# Patient Record
Sex: Female | Born: 1980 | Race: Black or African American | Hispanic: No | Marital: Single | Smoking: Never smoker
Health system: Southern US, Community
[De-identification: ages and names within clinical notes are randomized; demographics above are authoritative.]

## PROBLEM LIST (undated history)

## (undated) DIAGNOSIS — D649 Anemia, unspecified: Secondary | ICD-10-CM

## (undated) DIAGNOSIS — D569 Thalassemia, unspecified: Secondary | ICD-10-CM

---

## 2006-10-22 DIAGNOSIS — D569 Thalassemia, unspecified: Secondary | ICD-10-CM

## 2006-10-22 HISTORY — DX: Thalassemia, unspecified: D56.9

## 2012-06-07 ENCOUNTER — Emergency Department (HOSPITAL_COMMUNITY)
Admission: EM | Admit: 2012-06-07 | Discharge: 2012-06-07 | Disposition: A | Discharge status: Home / Self Care | Payer: BC Managed Care – HMO | Attending: Emergency Medicine | Admitting: Emergency Medicine

## 2012-06-07 ENCOUNTER — Encounter (HOSPITAL_COMMUNITY): Payer: Self-pay | Admitting: *Deleted

## 2012-06-07 DIAGNOSIS — I951 Orthostatic hypotension: Secondary | ICD-10-CM

## 2012-06-07 DIAGNOSIS — D649 Anemia, unspecified: Secondary | ICD-10-CM

## 2012-06-07 HISTORY — DX: Anemia, unspecified: D64.9

## 2012-06-07 HISTORY — DX: Thalassemia, unspecified: D56.9

## 2012-06-07 LAB — POCT I-STAT, CHEM 8
BUN: 16 mg/dL (ref 6–23)
Calcium, Ion: 1.21 mmol/L (ref 1.12–1.23)
Chloride: 103 meq/L (ref 96–112)
Creatinine, Ser: 0.9 mg/dL (ref 0.50–1.10)
Glucose, Bld: 95 mg/dL (ref 70–99)
HCT: 31 % — ABNORMAL LOW (ref 36.0–46.0)
Hemoglobin: 10.5 g/dL — ABNORMAL LOW (ref 12.0–15.0)
Potassium: 3.7 meq/L (ref 3.5–5.1)
Sodium: 140 meq/L (ref 135–145)
TCO2: 25 mmol/L (ref 0–100)

## 2012-06-07 NOTE — ED Provider Notes (Signed)
History     CSN: [id number]  Arrival date & time 06/07/12  1625   First MD Initiated Contact with Patient 06/07/12 1727      Chief Complaint  Patient presents with  . Weakness    HPI:  31 year old female with a history of anemia and probably a hemoglobinopathy, presenting with dizziness and fatigue.  Symptoms began this morning when she woke up with dizziness described as lightheadedness especially with position changes, fatigue, and cognitive impairment.  Today at work she has had trouble thinking and concentrating and has felt cold and tired.  She began menstruating last night and has already needed 6 pads.  She thinks it is heavier than usual.  Her last period was July around the same time.  Patient's mother has anemia and has required 6 transfusions during her lifetime.  Patient has never been told if her mother has a hemoglobinopathy.  During the patient's pregnancy 5 years ago, she was anemic needing a transfusion.  She remembers hearing at that time that her blood cells were "sickle like" and she thinks she remembers hearing thalassemia as well.  The patient's normal menstruations last 5 to 6 days and require 6 pads a day at the peak.   Past Medical History  Diagnosis Date  . Anemia   . Thalassemia 2008    Thinks she was told she has this during her pregnancy    History reviewed. No pertinent past surgical history.  Family History  Problem Relation Age of Onset  . Hypertension Father   . Anemia Mother     6 transfusions    History  Substance Use Topics  . Smoking status: Never Smoker   . Smokeless tobacco: Never Used  . Alcohol Use: No    OB History    Grav Para Term Preterm Abortions TAB SAB Ect Mult Living   2 1 1  0 1 0 0 0 0 1      Review of Systems  All other systems reviewed and are negative.    Allergies  Latex  Home Medications   Current Outpatient Rx  Name Route Sig Dispense Refill  . FERROUS SULFATE 325 (65 FE) MG PO TABS Oral Take 325 mg  by mouth daily.      BP 93/69  Pulse 75  Temp 98.1 F (36.7 C) (Oral)  Resp 20  SpO2 100%  LMP 06/07/2012  Physical Exam  Constitutional: She appears well-developed and well-nourished. No distress.  HENT:  Head: Normocephalic and atraumatic.  Mouth/Throat: Uvula is midline, oropharynx is clear and moist and mucous membranes are normal.  Eyes: Conjunctivae are normal. Pupils are equal, round, and reactive to light.  Neck: Neck supple. No mass and no thyromegaly present.  Cardiovascular: Normal rate, regular rhythm and normal heart sounds.   No murmur heard. Pulmonary/Chest: Effort normal and breath sounds normal.  Abdominal: Soft. Normal appearance and bowel sounds are normal. She exhibits no mass. There is no hepatosplenomegaly. There is no tenderness.  Skin: Skin is warm, dry and intact.    ED Course  Procedures (including critical care time)  Labs Reviewed  POCT I-STAT, CHEM 8 - Abnormal; Notable for the following:    Hemoglobin 10.5 (*)     HCT 31.0 (*)     All other components within normal limits   No results found.   1. Anemia   2. Orthostatic hypotension       MDM  1.   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  A hemoglobin of 10.5, if this is the case, would not be abnormal.  Her history of heavy menstruation could be compounding, but her hemoglobin is probably not decreased from baseline.  We instructed patient to take her iron supplement every day.  2.   Orthostatic hypotension:  Most likely hypovolemia from her heavy menstruation.  She is not on any medicine that would cause or worsen this.  Autonomic dysfunction is unlikely and adrenal insufficiency is less likely given her normal serum electrolytes, but this is certainly not ruled out.  Her presenting symptoms are most likely from hypotension rather than anemia.  Patient has been instructed to increase fluid and salt intake and establish with a primary care provider soon.  Lollie Sails, MD 06/07/12 [phone]

## 2012-06-07 NOTE — ED Notes (Signed)
Unable to use the bathroom at this time, pt is aware of the urine sample needed

## 2012-06-07 NOTE — ED Notes (Signed)
Pt reports hx of anemia, today having generalized weakness, foggy memory and chills. Pt thinks its due to anemia and wants hgb checked.

## 2012-06-07 NOTE — ED Provider Notes (Signed)
I reviewed the resident's note and I agree with the findings and plan.     Nelia Shi, MD 06/07/12 [phone]

## 2013-09-01 ENCOUNTER — Emergency Department (HOSPITAL_COMMUNITY)
Admission: EM | Admit: 2013-09-01 | Discharge: 2013-09-01 | Disposition: A | Discharge status: Home / Self Care | Payer: No Typology Code available for payment source | Attending: Emergency Medicine | Admitting: Emergency Medicine

## 2013-09-01 ENCOUNTER — Encounter (HOSPITAL_COMMUNITY): Payer: Self-pay | Admitting: Emergency Medicine

## 2013-09-01 ENCOUNTER — Emergency Department (HOSPITAL_COMMUNITY): Payer: No Typology Code available for payment source

## 2013-09-01 DIAGNOSIS — M545 Low back pain, unspecified: Secondary | ICD-10-CM | POA: Insufficient documentation

## 2013-09-01 DIAGNOSIS — Z91040 Latex allergy status: Secondary | ICD-10-CM | POA: Insufficient documentation

## 2013-09-01 DIAGNOSIS — Y92410 Unspecified street and highway as the place of occurrence of the external cause: Secondary | ICD-10-CM | POA: Insufficient documentation

## 2013-09-01 DIAGNOSIS — R109 Unspecified abdominal pain: Secondary | ICD-10-CM | POA: Insufficient documentation

## 2013-09-01 DIAGNOSIS — Z3202 Encounter for pregnancy test, result negative: Secondary | ICD-10-CM | POA: Insufficient documentation

## 2013-09-01 DIAGNOSIS — D649 Anemia, unspecified: Secondary | ICD-10-CM | POA: Insufficient documentation

## 2013-09-01 DIAGNOSIS — Y9389 Activity, other specified: Secondary | ICD-10-CM | POA: Insufficient documentation

## 2013-09-01 LAB — BASIC METABOLIC PANEL
CO2: 27 mEq/L (ref 19–32)
Calcium: 9.2 mg/dL (ref 8.4–10.5)
GFR calc non Af Amer: >90 mL/min (ref >90)
Potassium: 3.2 mEq/L — ABNORMAL LOW (ref 3.5–5.1)
Sodium: 139 mEq/L (ref 135–145)

## 2013-09-01 LAB — CBC
MCH: 16.8 pg — ABNORMAL LOW (ref 26.0–34.0)
Platelets: 321 10*3/uL (ref 150–400)
RBC: 4.29 MIL/uL (ref 3.87–5.11)

## 2013-09-01 LAB — URINALYSIS, ROUTINE W REFLEX MICROSCOPIC
Bilirubin Urine: NEGATIVE
Leukocytes, UA: NEGATIVE
Nitrite: NEGATIVE
Specific Gravity, Urine: 1.023 (ref 1.005–1.030)
Urobilinogen, UA: 0.2 mg/dL (ref 0.0–1.0)

## 2013-09-01 LAB — POCT PREGNANCY, URINE: Preg Test, Ur: NEGATIVE

## 2013-09-01 MED ORDER — IOHEXOL 300 MG/ML  SOLN
100.0000 mL | Freq: Once | INTRAMUSCULAR | Status: AC | PRN
  Administered 2013-09-01: 100 mL via INTRAVENOUS

## 2013-09-01 MED ORDER — FERROUS SULFATE 325 (65 FE) MG PO TABS: 325.0000 mg | ORAL_TABLET | Freq: Two times a day (BID) | ORAL | Status: DC

## 2013-09-01 MED ORDER — DOCUSATE SODIUM 100 MG PO CAPS: 100.0000 mg | ORAL_CAPSULE | Freq: Two times a day (BID) | ORAL | Status: DC

## 2013-09-01 MED ORDER — HYDROCODONE-ACETAMINOPHEN 5-325 MG PO TABS: 1.0000 | ORAL_TABLET | Freq: Four times a day (QID) | ORAL | Status: DC | PRN

## 2013-09-01 MED ORDER — MORPHINE SULFATE 4 MG/ML IJ SOLN
4.0000 mg | Freq: Once | INTRAMUSCULAR | Status: DC
  Filled 2013-09-01: qty 1
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
## 2013-09-01 NOTE — ED Notes (Signed)
Patient transported to CT 

## 2013-09-02 LAB — URINE CULTURE

## 2013-09-22 ENCOUNTER — Other Ambulatory Visit: Payer: Self-pay | Admitting: Obstetrics and Gynecology

## 2013-09-29 ENCOUNTER — Encounter (HOSPITAL_COMMUNITY): Payer: Self-pay | Admitting: Obstetrics and Gynecology

## 2013-10-07 ENCOUNTER — Encounter (HOSPITAL_COMMUNITY): Payer: BC Managed Care – PPO

## 2013-10-07 ENCOUNTER — Encounter (HOSPITAL_COMMUNITY): Payer: BC Managed Care – HMO

## 2013-10-09 ENCOUNTER — Ambulatory Visit (HOSPITAL_COMMUNITY)
Admission: RE | Admit: 2013-10-09 | Discharge: 2013-10-09 | Disposition: A | Discharge status: Home / Self Care | Payer: BC Managed Care – PPO | Source: Ambulatory Visit | Attending: Obstetrics and Gynecology | Admitting: Obstetrics and Gynecology

## 2013-10-09 ENCOUNTER — Encounter (HOSPITAL_COMMUNITY): Payer: Self-pay

## 2013-10-09 DIAGNOSIS — D563 Thalassemia minor: Secondary | ICD-10-CM

## 2013-10-09 DIAGNOSIS — D561 Beta thalassemia: Secondary | ICD-10-CM | POA: Insufficient documentation

## 2013-10-09 DIAGNOSIS — IMO0002 Reserved for concepts with insufficient information to code with codable children: Secondary | ICD-10-CM | POA: Insufficient documentation

## 2013-10-09 DIAGNOSIS — O99019 Anemia complicating pregnancy, unspecified trimester: Secondary | ICD-10-CM | POA: Insufficient documentation

## 2013-10-09 LAB — CBC
HCT: 26.8 % — ABNORMAL LOW (ref 36.0–46.0)
Hemoglobin: 7.7 g/dL — ABNORMAL LOW (ref 12.0–15.0)
WBC: 7.8 10*3/uL (ref 4.0–10.5)

## 2013-10-09 LAB — APTT: aPTT: 35 seconds (ref 24–37)

## 2013-10-09 LAB — IRON: Iron: 22 ug/dL — ABNORMAL LOW (ref 42–135)

## 2013-10-10 LAB — TRANSFERRIN: Transferrin: 357 mg/dL (ref 200–360)

## 2013-10-14 DIAGNOSIS — D563 Thalassemia minor: Secondary | ICD-10-CM | POA: Insufficient documentation
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
## 2013-10-22 NOTE — L&D Delivery Note (Signed)
Delivery Note At 7:19 PM a viable and healthy female was delivered via Vaginal, Spontaneous Delivery (Presentation: Left; Occiput Anterior).  APGAR: 8, 9; weight 9 lb 13.3 oz (4459 g).   Placenta status: Intact, Spontaneous.  Cord: 3 vessels   NICU team called to delivery due to thick meconium stained fluid. Infant delivered and passed to the team. Responded well and was left with the mother for couplet care  Anesthesia: Epidural  Episiotomy: None Lacerations: None Suture Repair: None Est. Blood Loss (mL): 300  Mom to postpartum.  Baby to Couplet care / Skin to Skin.  Brin Ruggerio H. 05/29/2014, 11:59 AM

## 2013-11-10 LAB — OB RESULTS CONSOLE HIV ANTIBODY (ROUTINE TESTING): HIV: NONREACTIVE

## 2013-11-10 LAB — OB RESULTS CONSOLE RUBELLA ANTIBODY, IGM: Rubella: IMMUNE

## 2013-11-10 LAB — OB RESULTS CONSOLE HEPATITIS B SURFACE ANTIGEN: Hepatitis B Surface Ag: NEGATIVE

## 2014-04-27 LAB — OB RESULTS CONSOLE GBS: STREP GROUP B AG: NEGATIVE

## 2014-05-28 ENCOUNTER — Inpatient Hospital Stay (HOSPITAL_COMMUNITY): Payer: BC Managed Care – PPO

## 2014-05-28 ENCOUNTER — Inpatient Hospital Stay (HOSPITAL_COMMUNITY)
Admission: AD | Admit: 2014-05-28 | Discharge: 2014-05-30 | DRG: 775 | Disposition: A | Discharge status: Home / Self Care | Payer: BC Managed Care – PPO | Source: Ambulatory Visit | Attending: Obstetrics and Gynecology | Admitting: Obstetrics and Gynecology

## 2014-05-28 ENCOUNTER — Encounter (HOSPITAL_COMMUNITY): Payer: BC Managed Care – PPO | Admitting: Anesthesiology

## 2014-05-28 ENCOUNTER — Encounter (HOSPITAL_COMMUNITY): Payer: Self-pay

## 2014-05-28 ENCOUNTER — Inpatient Hospital Stay (HOSPITAL_COMMUNITY): Payer: BC Managed Care – PPO | Admitting: Anesthesiology

## 2014-05-28 DIAGNOSIS — E669 Obesity, unspecified: Secondary | ICD-10-CM | POA: Diagnosis present

## 2014-05-28 DIAGNOSIS — Z6841 Body Mass Index (BMI) 40.0 and over, adult: Secondary | ICD-10-CM

## 2014-05-28 DIAGNOSIS — O3660X0 Maternal care for excessive fetal growth, unspecified trimester, not applicable or unspecified: Principal | ICD-10-CM | POA: Diagnosis present

## 2014-05-28 DIAGNOSIS — D563 Thalassemia minor: Secondary | ICD-10-CM

## 2014-05-28 DIAGNOSIS — O99214 Obesity complicating childbirth: Secondary | ICD-10-CM

## 2014-05-28 DIAGNOSIS — IMO0001 Reserved for inherently not codable concepts without codable children: Secondary | ICD-10-CM

## 2014-05-28 LAB — CBC
HEMATOCRIT: 34 % — AB (ref 36.0–46.0)
Hemoglobin: 10.7 g/dL — ABNORMAL LOW (ref 12.0–15.0)
MCH: 20.8 pg — ABNORMAL LOW (ref 26.0–34.0)
MCHC: 31.5 g/dL (ref 30.0–36.0)
MCV: 66.1 fL — AB (ref 78.0–100.0)
PLATELETS: 154 10*3/uL (ref 150–400)
RBC: 5.14 MIL/uL — AB (ref 3.87–5.11)
RDW: 19.9 % — AB (ref 11.5–15.5)
WBC: 6.7 10*3/uL (ref 4.0–10.5)

## 2014-05-28 LAB — TYPE AND SCREEN
ABO/RH(D): A POS
ANTIBODY SCREEN: NEGATIVE

## 2014-05-28 LAB — ABO/RH: ABO/RH(D): A POS

## 2014-05-28 LAB — RPR

## 2014-05-28 MED ORDER — PRENATAL MULTIVITAMIN CH
1.0000 | ORAL_TABLET | Freq: Every day | ORAL | Status: DC
  Administered 2014-05-29: 1 via ORAL
  Filled 2014-05-28 (×2): qty 1

## 2014-05-28 MED ORDER — WITCH HAZEL-GLYCERIN EX PADS: 1.0000 "application " | MEDICATED_PAD | CUTANEOUS | Status: DC | PRN

## 2014-05-28 MED ORDER — IBUPROFEN 600 MG PO TABS
600.0000 mg | ORAL_TABLET | Freq: Four times a day (QID) | ORAL | Status: DC
  Administered 2014-05-29 – 2014-05-30 (×7): 600 mg via ORAL
  Filled 2014-05-28 (×7): qty 1

## 2014-05-28 MED ORDER — OXYCODONE-ACETAMINOPHEN 5-325 MG PO TABS: 1.0000 | ORAL_TABLET | ORAL | Status: DC | PRN

## 2014-05-28 MED ORDER — LACTATED RINGERS IV SOLN
500.0000 mL | INTRAVENOUS | Status: DC | PRN
  Administered 2014-05-28: 500 mL via INTRAVENOUS

## 2014-05-28 MED ORDER — DIPHENHYDRAMINE HCL 50 MG/ML IJ SOLN: 12.5000 mg | INTRAMUSCULAR | Status: DC | PRN

## 2014-05-28 MED ORDER — EPHEDRINE 5 MG/ML INJ
10.0000 mg | INTRAVENOUS | Status: DC | PRN
  Filled 2014-05-28: qty 2

## 2014-05-28 MED ORDER — BUPIVACAINE HCL (PF) 0.25 % IJ SOLN
INTRAMUSCULAR | Status: DC | PRN
  Administered 2014-05-28: 10 mL

## 2014-05-28 MED ORDER — ONDANSETRON HCL 4 MG/2ML IJ SOLN: 4.0000 mg | Freq: Four times a day (QID) | INTRAMUSCULAR | Status: DC | PRN

## 2014-05-28 MED ORDER — LACTATED RINGERS IV SOLN
INTRAVENOUS | Status: DC
  Administered 2014-05-28 (×3): via INTRAVENOUS

## 2014-05-28 MED ORDER — PHENYLEPHRINE 40 MCG/ML (10ML) SYRINGE FOR IV PUSH (FOR BLOOD PRESSURE SUPPORT)
80.0000 ug | PREFILLED_SYRINGE | INTRAVENOUS | Status: DC | PRN
  Filled 2014-05-28: qty 2
  Filled 2014-05-28: qty 10

## 2014-05-28 MED ORDER — TETANUS-DIPHTH-ACELL PERTUSSIS 5-2.5-18.5 LF-MCG/0.5 IM SUSP: 0.5000 mL | Freq: Once | INTRAMUSCULAR | Status: DC

## 2014-05-28 MED ORDER — METHYLERGONOVINE MALEATE 0.2 MG PO TABS
0.2000 mg | ORAL_TABLET | ORAL | Status: DC | PRN
Start: 2014-05-28 — End: 2014-05-30

## 2014-05-28 MED ORDER — LACTATED RINGERS IV SOLN: 500.0000 mL | Freq: Once | INTRAVENOUS | Status: DC

## 2014-05-28 MED ORDER — CITRIC ACID-SODIUM CITRATE 334-500 MG/5ML PO SOLN: 30.0000 mL | ORAL | Status: DC | PRN

## 2014-05-28 MED ORDER — TERBUTALINE SULFATE 1 MG/ML IJ SOLN: 0.2500 mg | Freq: Once | INTRAMUSCULAR | Status: DC | PRN

## 2014-05-28 MED ORDER — ACETAMINOPHEN 325 MG PO TABS: 650.0000 mg | ORAL_TABLET | ORAL | Status: DC | PRN

## 2014-05-28 MED ORDER — DIPHENHYDRAMINE HCL 25 MG PO CAPS: 25.0000 mg | ORAL_CAPSULE | Freq: Four times a day (QID) | ORAL | Status: DC | PRN

## 2014-05-28 MED ORDER — METHYLERGONOVINE MALEATE 0.2 MG/ML IJ SOLN
INTRAMUSCULAR | Status: AC
  Administered 2014-05-28: 0.2 mg via INTRAMUSCULAR
  Filled 2014-05-28: qty 1

## 2014-05-28 MED ORDER — ONDANSETRON HCL 4 MG/2ML IJ SOLN: 4.0000 mg | INTRAMUSCULAR | Status: DC | PRN

## 2014-05-28 MED ORDER — ONDANSETRON HCL 4 MG PO TABS: 4.0000 mg | ORAL_TABLET | ORAL | Status: DC | PRN

## 2014-05-28 MED ORDER — LANOLIN HYDROUS EX OINT: TOPICAL_OINTMENT | CUTANEOUS | Status: DC | PRN

## 2014-05-28 MED ORDER — SIMETHICONE 80 MG PO CHEW: 80.0000 mg | CHEWABLE_TABLET | ORAL | Status: DC | PRN

## 2014-05-28 MED ORDER — OXYTOCIN 40 UNITS IN LACTATED RINGERS INFUSION - SIMPLE MED
62.5000 mL/h | INTRAVENOUS | Status: DC
  Filled 2014-05-28: qty 1000

## 2014-05-28 MED ORDER — BENZOCAINE-MENTHOL 20-0.5 % EX AERO
1.0000 "application " | INHALATION_SPRAY | CUTANEOUS | Status: DC | PRN
  Administered 2014-05-29: 1 via TOPICAL
  Filled 2014-05-28: qty 56

## 2014-05-28 MED ORDER — DIBUCAINE 1 % RE OINT: 1.0000 "application " | TOPICAL_OINTMENT | RECTAL | Status: DC | PRN

## 2014-05-28 MED ORDER — OXYTOCIN 40 UNITS IN LACTATED RINGERS INFUSION - SIMPLE MED
1.0000 m[IU]/min | INTRAVENOUS | Status: DC
  Administered 2014-05-28: 2 m[IU]/min via INTRAVENOUS

## 2014-05-28 MED ORDER — SENNOSIDES-DOCUSATE SODIUM 8.6-50 MG PO TABS
2.0000 | ORAL_TABLET | ORAL | Status: DC
  Administered 2014-05-29: 2 via ORAL
  Filled 2014-05-28 (×2): qty 2

## 2014-05-28 MED ORDER — PHENYLEPHRINE 40 MCG/ML (10ML) SYRINGE FOR IV PUSH (FOR BLOOD PRESSURE SUPPORT)
80.0000 ug | PREFILLED_SYRINGE | INTRAVENOUS | Status: DC | PRN
  Administered 2014-05-28: 80 ug via INTRAVENOUS
  Filled 2014-05-28: qty 2
  Filled 2014-05-28: qty 10

## 2014-05-28 MED ORDER — IBUPROFEN 600 MG PO TABS
600.0000 mg | ORAL_TABLET | Freq: Four times a day (QID) | ORAL | Status: DC | PRN
  Administered 2014-05-28: 600 mg via ORAL
  Filled 2014-05-28: qty 1

## 2014-05-28 MED ORDER — LIDOCAINE HCL (PF) 1 % IJ SOLN
30.0000 mL | INTRAMUSCULAR | Status: DC | PRN
  Filled 2014-05-28: qty 30

## 2014-05-28 MED ORDER — LIDOCAINE HCL (PF) 1 % IJ SOLN
INTRAMUSCULAR | Status: DC | PRN
  Administered 2014-05-28 (×4): 5 mL

## 2014-05-28 MED ORDER — FENTANYL 2.5 MCG/ML BUPIVACAINE 1/10 % EPIDURAL INFUSION (WH - ANES)
14.0000 mL/h | INTRAMUSCULAR | Status: DC | PRN
  Administered 2014-05-28: 14 mL/h via EPIDURAL
  Filled 2014-05-28: qty 125

## 2014-05-28 MED ORDER — OXYTOCIN BOLUS FROM INFUSION
500.0000 mL | INTRAVENOUS | Status: DC
  Administered 2014-05-28: 500 mL via INTRAVENOUS

## 2014-05-28 MED ORDER — EPHEDRINE 5 MG/ML INJ
10.0000 mg | INTRAVENOUS | Status: DC | PRN
Start: 2014-05-28 — End: 2014-05-28
  Filled 2014-05-28: qty 2

## 2014-05-28 MED ORDER — METHYLERGONOVINE MALEATE 0.2 MG/ML IJ SOLN
0.2000 mg | INTRAMUSCULAR | Status: DC | PRN
  Administered 2014-05-28: 0.2 mg via INTRAMUSCULAR

## 2014-05-28 MED ORDER — ZOLPIDEM TARTRATE 5 MG PO TABS: 5.0000 mg | ORAL_TABLET | Freq: Every evening | ORAL | Status: DC | PRN

## 2014-05-28 NOTE — Anesthesia Procedure Notes (Addendum)
Epidural Patient location during procedure: OB Start time: 05/28/2014 2:16 PM  Staffing Anesthesiologist: Brayton CavesJACKSON, Devan Babino Performed by: anesthesiologist   Preanesthetic Checklist Completed: patient identified, site marked, surgical consent, pre-op evaluation, timeout performed, IV checked, risks and benefits discussed and monitors and equipment checked  Epidural Patient position: sitting Prep: site prepped and draped and DuraPrep Patient monitoring: continuous pulse ox and blood pressure Approach: midline Location: L3-L4 Injection technique: LOR air  Needle:  Needle type: Tuohy  Needle gauge: 17 G Needle length: 9 cm and 9 Needle insertion depth: 6 cm Catheter type: closed end flexible Catheter size: 19 Gauge Catheter at skin depth: 10 cm Test dose: negative  Assessment Events: blood not aspirated, injection not painful, no injection resistance, negative IV test and no paresthesia  Additional Notes Patient identified.  Risk benefits discussed including failed block, incomplete pain control, headache, nerve damage, paralysis, blood pressure changes, nausea, vomiting, reactions to medication both toxic or allergic, and postpartum back pain.  Patient expressed understanding and wished to proceed.  All questions were answered.  Sterile technique used throughout procedure and epidural site dressed with sterile barrier dressing. No paresthesia or other complications noted.The patient did not experience any signs of intravascular injection such as tinnitus or metallic taste in mouth nor signs of intrathecal spread such as rapid motor block. Please see nursing notes for vital signs.   Epidural Patient location during procedure: OB Start time: 05/28/2014 4:53 PM  Staffing Anesthesiologist: Brayton CavesJACKSON, Byan Poplaski Performed by: anesthesiologist   Preanesthetic Checklist Completed: patient identified, site marked, surgical consent, pre-op evaluation, timeout performed, IV checked, risks and  benefits discussed and monitors and equipment checked  Epidural Patient position: sitting Prep: site prepped and draped and DuraPrep Patient monitoring: continuous pulse ox and blood pressure Approach: midline Location: L3-L4 Injection technique: LOR air  Needle:  Needle type: Tuohy  Needle gauge: 17 G Needle length: 9 cm and 9 Needle insertion depth: 7 cm Catheter type: closed end flexible Catheter size: 19 Gauge Catheter at skin depth: 15 cm Test dose: negative  Assessment Events: blood not aspirated, injection not painful, no injection resistance, negative IV test and no paresthesia  Additional Notes Patient identified.  Risk benefits discussed including failed block, incomplete pain control, headache, nerve damage, paralysis, blood pressure changes, nausea, vomiting, reactions to medication both toxic or allergic, and postpartum back pain.  Patient expressed understanding and wished to proceed.  All questions were answered.  Sterile technique used throughout procedure and epidural site dressed with sterile barrier dressing. No paresthesia or other complications noted.The patient did not experience any signs of intravascular injection such as tinnitus or metallic taste in mouth nor signs of intrathecal spread such as rapid motor block. Please see nursing notes for vital signs.

## 2014-05-28 NOTE — Progress Notes (Signed)
Delivery of live viable female by Dr Tenny Crawoss, APGARS 8,9

## 2014-05-28 NOTE — Anesthesia Preprocedure Evaluation (Signed)
Anesthesia Evaluation  Patient identified by MRN, date of birth, ID band Patient awake    Reviewed: Allergy & Precautions, H&P , Patient's Chart, lab work & pertinent test results  Airway Mallampati: III  TM Distance: >3 FB Neck ROM: full    Dental   Pulmonary  breath sounds clear to auscultation        Cardiovascular Rhythm:regular Rate:Normal     Neuro/Psych    GI/Hepatic   Endo/Other  Morbid obesity  Renal/GU      Musculoskeletal   Abdominal   Peds  Hematology   Anesthesia Other Findings   Reproductive/Obstetrics (+) Pregnancy                             Anesthesia Physical Anesthesia Plan  ASA: III  Anesthesia Plan: Epidural   Post-op Pain Management:    Induction:   Airway Management Planned:   Additional Equipment:   Intra-op Plan:   Post-operative Plan:   Informed Consent: I have reviewed the patients History and Physical, chart, labs and discussed the procedure including the risks, benefits and alternatives for the proposed anesthesia with the patient or authorized representative who has indicated his/her understanding and acceptance.     Plan Discussed with:   Anesthesia Plan Comments:         Anesthesia Quick Evaluation  

## 2014-05-28 NOTE — Progress Notes (Signed)
Dr Tenny Crawoss notified of FHR tracing, patient pushing progress, SVE, UCs, and interventions provided. Provider on way to attend delivery.

## 2014-05-28 NOTE — MAU Note (Signed)
Per Dr Dareen PianoAnderson, get reactive tracing, ambulate and recheck cervix.

## 2014-05-28 NOTE — MAU Note (Signed)
Pt c/o contractions every 4-5 mins. +FM. Denies LOF or vag bleeding

## 2014-05-29 LAB — CBC
HCT: 27.5 % — ABNORMAL LOW (ref 36.0–46.0)
Hemoglobin: 8.8 g/dL — ABNORMAL LOW (ref 12.0–15.0)
MCH: 20.9 pg — AB (ref 26.0–34.0)
MCHC: 32 g/dL (ref 30.0–36.0)
MCV: 65.3 fL — ABNORMAL LOW (ref 78.0–100.0)
PLATELETS: 145 10*3/uL — AB (ref 150–400)
RBC: 4.21 MIL/uL (ref 3.87–5.11)
RDW: 19.7 % — AB (ref 11.5–15.5)
WBC: 11 10*3/uL — AB (ref 4.0–10.5)

## 2014-05-29 NOTE — Lactation Note (Signed)
This note was copied from the chart of Lindsey Santiago Cubillos. Lactation Consultation Note: Initial visit with this experienced BF mom. She reports that baby has been latching well- last fed about 45 minutes ago. Now asleep skin to skin with mom. BF brochure given with resources for support after DC. Mom reports she had trouble with engorgement with last baby- reviewed engorgement prevention and treatment. No further questions at present. Has pump for home.To call prn.  Patient Name: Lindsey Santiago Ganser WNUUV'OToday's Date: 05/29/2014 Reason for consult: Initial assessment   Maternal Data Formula Feeding for Exclusion: No Does the patient have breastfeeding experience prior to this delivery?: Yes  Feeding Feeding Type: Breast Fed  LATCH Score/Interventions   Lactation Tools Discussed/Used     Consult Status Consult Status: Follow-up Date: 05/30/14 Follow-up type: In-patient    Pamelia HoitWeeks, Chriss Redel D 05/29/2014, 10:49 AM

## 2014-05-29 NOTE — Progress Notes (Signed)
Post Partum Day 1 Subjective: no complaints, up ad lib, voiding, tolerating PO and + flatus Breastfeeding  Objective: Blood pressure 95/50, pulse 80, temperature 98.3 F (36.8 C), temperature source Oral, resp. rate 16, height 5' 6.2" (1.681 m), weight 120.929 kg (266 lb 9.6 oz), last menstrual period 08/17/2013, SpO2 98.00%, unknown if currently breastfeeding.  Physical Exam:  General: alert, cooperative and appears stated age Lochia: appropriate Uterine Fundus: firm   Recent Labs  05/28/14 0958 05/29/14 0635  HGB 10.7* 8.8*  HCT 34.0* 27.5*    Assessment/Plan: Plan for discharge tomorrow and Breastfeeding Desire neonatal circumcision. Risks, benefits, alternatives reviewed. Patient wishes to proceed Baby needs to void   LOS: 1 day   Lindsey Santiago H. 05/29/2014, 12:38 PM

## 2014-05-29 NOTE — Anesthesia Postprocedure Evaluation (Signed)
  Anesthesia Post-op Note  Patient: Lindsey Santiago  Procedure(s) Performed: * No procedures listed *  Patient Location: PACU and Mother/Baby  Anesthesia Type:Epidural  Level of Consciousness: awake, alert  and oriented  Airway and Oxygen Therapy: Patient Spontanous Breathing  Post-op Pain: none  Post-op Assessment: Post-op Vital signs reviewed, Patient's Cardiovascular Status Stable, Respiratory Function Stable, No headache, No backache, No residual numbness and No residual motor weakness  Post-op Vital Signs: Reviewed and stable  Last Vitals:  Filed Vitals:   05/29/14 0400  BP: 95/50  Pulse: 80  Temp: 36.8 C  Resp: 16    Complications: No apparent anesthesia complications

## 2014-05-30 MED ORDER — OXYCODONE-ACETAMINOPHEN 5-325 MG PO TABS: 2.0000 | ORAL_TABLET | ORAL | Status: DC | PRN

## 2014-05-30 MED ORDER — DOCUSATE SODIUM 100 MG PO CAPS: 100.0000 mg | ORAL_CAPSULE | Freq: Two times a day (BID) | ORAL | Status: DC

## 2014-05-30 MED ORDER — IBUPROFEN 600 MG PO TABS: 600.0000 mg | ORAL_TABLET | Freq: Four times a day (QID) | ORAL | Status: DC | PRN

## 2014-05-30 NOTE — Discharge Summary (Signed)
Obstetric Discharge Summary Reason for Admission: onset of labor Prenatal Procedures: ultrasound Intrapartum Procedures: spontaneous vaginal delivery Postpartum Procedures: none Complications-Operative and Postpartum: none Hemoglobin  Date Value Ref Range Status  05/29/2014 8.8* 12.0 - 15.0 g/dL Final     REPEATED TO VERIFY     DELTA CHECK NOTED     HCT  Date Value Ref Range Status  05/29/2014 27.5* 36.0 - 46.0 % Final    Physical Exam:  General: alert, cooperative and appears stated age 37Lochia: appropriate Uterine Fundus: firm  Discharge Diagnoses: Term Pregnancy-delivered  Discharge Information: Date: 05/30/2014 Activity: pelvic rest Diet: routine Medications: Ibuprofen, Colace and Percocet Condition: improved Instructions: refer to practice specific booklet Discharge to: home Follow-up Information   Follow up with Almon HerculesOSS,Jaimin Krupka H., MD In 4 weeks. (For a postpartum evaluation)    Specialty:  Obstetrics and Gynecology   Contact information:   [address] [place] [address] [phone]       Newborn Data: Live born female  Birth Weight: 9 lb 13.3 oz (4459 g) APGAR: 8, 9  Home with mother.  Hamlin Devine H. 05/30/2014, 10:24 AM

## 2014-05-30 NOTE — Lactation Note (Signed)
This note was copied from the chart of Lindsey Rhina BrackettFaith Szalkowski. Lactation Consultation Note; Follow up visit with mom. Baby is now 8438 hours old and has had 2 stools but no void yet. Mom reports that he cluster fed through the night but feels her breasts are feeling much heavier this morning and she is leaking. Reports that he is latching on well with no pain. Baby asleep in bassinet- last fed 1 hour ago. No questions at present. Encouraged to feed whenever she sees feeding cues. To call prn  Patient Name: Lindsey Santiago [id number]'[id number]'s Date: 05/30/2014 Reason for consult: Follow-up assessment   Maternal Data    Feeding Feeding Type: Breast Fed  LATCH Score/Interventions                      Lactation Tools Discussed/Used     Consult Status Consult Status: Complete    Pamelia HoitWeeks, Margurete Guaman D 05/30/2014, 9:28 AM

## 2014-06-15 NOTE — H&P (Signed)
Lindsey Santiago is a 33 y.o. female presenting for labor  G3P1011 presents in labor. Her pregnancy has been complicated by anemia History OB History as of 06/12/14   Grav Para Term Preterm Abortions TAB SAB Ect Mult Living   0 1 0 0 0 0 2     Past Medical History  Diagnosis Date  . Anemia   . Thalassemia 2008    Thinks she was told she has this during her pregnancy   History reviewed. No pertinent past surgical history. Family History: family history includes Anemia in her mother; Hypertension in her father. Social History:  reports that she has never smoked. She has never used smokeless tobacco. She reports that she does not drink alcohol or use illicit drugs.   Prenatal Transfer Tool  Maternal Diabetes: No Genetic Screening: Normal Maternal Ultrasounds/Referrals: Normal Fetal Ultrasounds or other Referrals:  None Maternal Substance Abuse:  No Significant Maternal Medications:  None Significant Maternal Lab Results:  None Other Comments:  None  ROS  Dilation: 10 Effacement (%): 100 Station: +2 Exam by:: L. McDaniel RN Blood pressure 120/64, pulse 74, temperature 97.6 F (36.4 C), temperature source Oral, resp. rate 20, height 5' 6.2" (1.681 m), weight 120.929 kg (266 lb 9.6 oz), last menstrual period 08/17/2013, SpO2 99.00%, unknown if currently breastfeeding. Exam Physical Exam  Prenatal labs: ABO, Rh: --/--/A POS, A POS (08/07 0958) Antibody: NEG (08/07 0958) Rubella: Immune (01/20 0000) RPR: NON REAC (08/07 0958)  HBsAg: Negative (01/20 0000)  HIV: Non-reactive (01/20 0000)  GBS: Negative (07/07 0000)   Assessment/Plan: 1) Admit 2) Epidural   Lindsey Santiago H. 06/15/2014, 8:03 PM

## 2014-08-23 ENCOUNTER — Encounter (HOSPITAL_COMMUNITY): Payer: Self-pay
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
## 2017-04-22 ENCOUNTER — Telehealth: Payer: Self-pay | Admitting: *Deleted

## 2017-04-22 ENCOUNTER — Encounter: Payer: Self-pay | Admitting: *Deleted

## 2017-04-22 NOTE — Telephone Encounter (Signed)
Patient not currently on any medications

## 2017-04-23 ENCOUNTER — Encounter: Payer: Self-pay | Admitting: Family Medicine

## 2017-04-23 ENCOUNTER — Ambulatory Visit (INDEPENDENT_AMBULATORY_CARE_PROVIDER_SITE_OTHER): Payer: 59 | Admitting: Family Medicine

## 2017-04-23 VITALS — BP 104/68 | HR 92 | Temp 98.5°F | Ht 67.0 in | Wt 258.2 lb

## 2017-04-23 DIAGNOSIS — Z020 Encounter for examination for admission to educational institution: Secondary | ICD-10-CM | POA: Diagnosis not present

## 2017-04-23 LAB — HEPATITIS A ANTIBODY, TOTAL: Hep A Total Ab: REACTIVE — AB

## 2017-04-23 NOTE — Progress Notes (Signed)
Lindsey Santiago is a 35 y.o. female is here to [facility].   Patient Care Team: Helane Rima, DO as PCP - General (Family Medicine)   History of Present Illness:   Lindsey Santiago CMA acting as scribe for Dr. Earlene Plater.  HPI: Patient comes in today to establish care. 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   Health Maintenance Due  Topic Date Due  . HIV Screening  03/25/1998  . TETANUS/TDAP  03/25/2002  . PAP SMEAR  09/22/2016   PMHx, SurgHx, SocialHx, Medications, and Allergies were reviewed in the Visit Navigator and updated as appropriate.   Past Medical History:  Diagnosis Date  . Anemia   . Thalassemia 2008   Thinks she was told she has this during her pregnancy   No past surgical history on file.  Family History  Problem Relation Age of Onset  . Anemia Mother        6 transfusions  . Hypertension Father    Social History  Substance Use Topics  . Smoking status: Never Smoker  . Smokeless tobacco: Never Used  . Alcohol use No   Current Medications and Allergies:   No current outpatient prescriptions on file.  Allergies  Allergen Reactions  . Latex Rash   Review of Systems:   Review of Systems  Constitutional: Negative for chills, diaphoresis, fever, malaise/fatigue and weight loss.  HENT: Negative for hearing loss.   Eyes: Negative for blurred vision.  Respiratory: Negative for cough, shortness of breath and wheezing.   Cardiovascular: Negative for chest pain, palpitations and leg swelling.  Gastrointestinal: Negative for abdominal pain, constipation, diarrhea, heartburn, nausea and vomiting.  Genitourinary: Negative for dysuria, flank pain, frequency, hematuria and urgency.  Musculoskeletal: Negative for joint pain and myalgias.  Skin: Negative for rash.  Neurological: Negative for dizziness, weakness and headaches.  Psychiatric/Behavioral: Negative for depression, substance abuse and suicidal ideas. The patient is not  nervous/anxious and does not have insomnia.    Vitals:   Vitals:   04/23/17 0925  BP: 104/68  Pulse: 92  Temp: 98.5 F (36.9 C)  TempSrc: Oral  SpO2: 97%  Weight: 258 lb 3.2 oz (117.1 kg)  Height: 5\' 7"  (1.702 m)     Body mass index is 40.44 kg/m.  Physical Exam:   Physical Exam  Constitutional: She is oriented to person, place, and time. She appears well-developed and well-nourished. No distress.  HENT:  Head: Normocephalic and atraumatic.  Right Ear: External ear normal.  Left Ear: External ear normal.  Nose: Nose normal.  Mouth/Throat: Oropharynx is clear and moist.  Eyes: Conjunctivae and EOM are normal. Pupils are equal, round, and reactive to light.  Neck: Normal range of motion. Neck supple. No thyromegaly present.  Cardiovascular: Normal rate, regular rhythm, normal heart sounds and intact distal pulses.   Pulmonary/Chest: Effort normal and breath sounds normal.  Abdominal: Soft. Bowel sounds are normal.  Musculoskeletal: Normal range of motion.  Neurological: She is alert and oriented to person, place, and time.  Skin: Skin is warm and dry.  Psychiatric: She has a normal mood and affect. Her behavior is normal.  Nursing note and vitals reviewed.  Assessment and Plan:   Lindsey Santiago was seen today for establish care.  Diagnoses and all orders for this visit:  School physical exam -     Varicella zoster antibody, IgG -     Hepatitis A Ab, Total -     PPD  Patient Counseling:   [x]   Nutrition: Stressed importance of moderation in sodium/caffeine intake, saturated fat and cholesterol, caloric balance, sufficient intake of fresh fruits, vegetables, fiber, calcium, iron, and 1 mg of folate supplement per day (for females capable of pregnancy).   [x]      Stressed the importance of regular exercise.    [x]     Substance Abuse: Discussed cessation/primary prevention of tobacco, alcohol, or other drug use; driving or other dangerous activities under the  influence; availability of treatment for abuse.    [x]      Injury prevention: Discussed safety belts, safety helmets, smoke detector, smoking near bedding or upholstery.    [x]      Sexuality: Discussed sexually transmitted diseases, partner selection, use of condoms, avoidance of unintended pregnancy  and contraceptive alternatives.    [x]     Dental health: Discussed importance of regular tooth brushing, flossing, and dental visits.   [x]      Health maintenance and immunizations reviewed. Please refer to Health maintenance section.   . Reviewed expectations re: course of current medical issues. . Discussed self-management of symptoms. . Outlined signs and symptoms indicating need for more acute intervention. . Patient verbalized understanding and all questions were answered. Marland Kitchen. Health Maintenance issues including appropriate healthy diet, exercise, and smoking avoidance were discussed with patient. . See orders for this visit as documented in the electronic medical record. . Patient received an After Visit Summary.  CMA served as Neurosurgeonscribe during this visit. History, Physical, and Plan performed by medical provider. The above documentation has been reviewed and is accurate and complete. Helane RimaErica Rylyn Santiago, D.O.  Helane RimaErica Alleen Kehm, DO [place] [place] 04/23/2017  Future Appointments Date Time Provider Department Center  04/25/2017 10:00 AM [facility] NURSE [facility] None

## 2017-04-25 ENCOUNTER — Telehealth: Payer: Self-pay | Admitting: Family Medicine

## 2017-04-25 ENCOUNTER — Ambulatory Visit: Payer: 59

## 2017-04-25 ENCOUNTER — Other Ambulatory Visit: Payer: Self-pay | Admitting: Sports Medicine

## 2017-04-25 DIAGNOSIS — M542 Cervicalgia: Secondary | ICD-10-CM

## 2017-04-25 LAB — VARICELLA ZOSTER ANTIBODY, IGG: Varicella IgG: 857.5 Index — ABNORMAL HIGH (ref <135.00)

## 2017-04-25 LAB — TB SKIN TEST
Induration: 0 mm
TB Skin Test: NEGATIVE

## 2017-04-26 NOTE — Telephone Encounter (Signed)
Forms put up front for patient to pick up.

## 2017-05-05 ENCOUNTER — Other Ambulatory Visit: Payer: 59

## 2017-10-17 ENCOUNTER — Encounter: Payer: Self-pay | Admitting: Family Medicine

## 2017-10-17 ENCOUNTER — Ambulatory Visit (INDEPENDENT_AMBULATORY_CARE_PROVIDER_SITE_OTHER): Payer: 59 | Admitting: Family Medicine

## 2017-10-17 VITALS — BP 124/78 | HR 86 | Temp 98.5°F | Ht 67.0 in | Wt 256.6 lb

## 2017-10-17 DIAGNOSIS — R21 Rash and other nonspecific skin eruption: Secondary | ICD-10-CM | POA: Diagnosis not present

## 2017-10-17 MED ORDER — LIDOCAINE HCL 2 % EX GEL: 1.0000 "application " | CUTANEOUS | 0 refills | Status: DC | PRN

## 2017-10-17 MED ORDER — VALACYCLOVIR HCL 1 G PO TABS: 1000.0000 mg | ORAL_TABLET | Freq: Three times a day (TID) | ORAL | 0 refills | Status: AC

## 2017-10-17 NOTE — Patient Instructions (Signed)
Start the valtrex and lidocaine gel.  We will call you with results of your test.  Come back if not improving.  Take care, Dr Jimmey RalphParker

## 2017-10-17 NOTE — Progress Notes (Signed)
    Subjective:  Lindsey Santiago is a 36 y.o. female who presents today with a chief complaint of rash.   HPI:  Rash, acute issue Symptoms started about 5 days ago.  Patient was having URI symptoms and took some leftover prednisone that she had from a few months prior.  The day after she noticed a rash stretching from her right side of her back across her flank and into her right abdomen.  Symptoms have been stable over the last couple of days.  No fevers or chills.  [facility] who told her that it was a reaction to the prednisone and that she should stop this medication.  No new soaps, detergents, perfumes, lotions, or other exposures.  She has not tried any other treatments.  No other obvious precipitating events.  No other obvious alleviating or   ROS: Per HPI  PMH: Smoking history reviewed.  Never smoker.  Objective:  Physical Exam: BP 124/78 (BP Location: Left Arm, Patient Position: Sitting, Cuff Size: Normal)   Pulse 86   Temp 98.5 F (36.9 C) (Oral)   Ht 5\' 7"  (1.702 m)   Wt 256 lb 9.6 oz (116.4 kg)   SpO2 97%   BMI 40.19 kg/m   Gen: NAD, resting comfortably Skin: Vesicular, erythematous rash in dermatomal distribution from back to abdomen.  See below picture.       Assessment/Plan:  Rash Likely varicella zoster.  Likely precipitated due to recent prednisone intake.  We will send for PCR testing to confirm this.  Start Valtrex 1 g 3 times daily for 7 days.  Also prescribed topical lidocaine jelly to help with her discomfort.  Advised patient that this was unlikely to represent a reaction to her prednisone.  Katina Degreealeb M. Jimmey RalphParker, MD 10/17/2017 4:35 PM

## 2017-10-21 LAB — VARICELLA-ZOSTER BY PCR: VZV DNA, QL PCR: DETECTED — AB

## 2017-10-21 NOTE — Progress Notes (Signed)
Please let patient know her text confirmed shingles. No changes needed to her treatment plan at this time. She should follow up with me or her PCP if her rash is not improving.

## 2018-04-15 ENCOUNTER — Ambulatory Visit (INDEPENDENT_AMBULATORY_CARE_PROVIDER_SITE_OTHER): Payer: 59 | Admitting: Family Medicine

## 2018-04-15 ENCOUNTER — Encounter: Payer: Self-pay | Admitting: Family Medicine

## 2018-04-15 VITALS — BP 124/82 | HR 74 | Temp 98.5°F | Ht 67.0 in | Wt 263.2 lb

## 2018-04-15 DIAGNOSIS — R635 Abnormal weight gain: Secondary | ICD-10-CM

## 2018-04-15 DIAGNOSIS — R600 Localized edema: Secondary | ICD-10-CM | POA: Insufficient documentation

## 2018-04-15 DIAGNOSIS — N912 Amenorrhea, unspecified: Secondary | ICD-10-CM | POA: Insufficient documentation

## 2018-04-15 DIAGNOSIS — D649 Anemia, unspecified: Secondary | ICD-10-CM | POA: Insufficient documentation

## 2018-04-15 MED ORDER — PHENTERMINE HCL 37.5 MG PO TABS: 37.5000 mg | ORAL_TABLET | Freq: Every day | ORAL | 0 refills | Status: AC

## 2018-04-15 NOTE — Progress Notes (Signed)
Lindsey Santiago is a 37 y.o. female is here for follow up.  History of Present Illness:   Lindsey Santiago CMA acting as scribe for Dr. Earlene Plater.  HPI: Patient comes in today to discuss starting a weight loss medication. 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 This was very stressful and she could not lose weight during this time. She now would like to get serious about loosing weight.   Health Maintenance Due  Topic Date Due  . PAP SMEAR  09/22/2016   Depression screen PHQ 2/9 04/15/2018  Decreased Interest 0  Down, Depressed, Hopeless 0  PHQ - 2 Score 0   PMHx, SurgHx, SocialHx, FamHx, Medications, and Allergies were reviewed in the Visit Navigator and updated as appropriate.   Patient Active Problem List   Diagnosis Date Noted  . Beta thalassemia trait 10/14/2013   Social History   Tobacco Use  . Smoking status: Never Smoker  . Smokeless tobacco: Never Used  Substance Use Topics  . Alcohol use: No  . Drug use: No   Current Medications and Allergies:   .  None   Allergies  Allergen Reactions  . Latex Rash   Review of Systems   Pertinent items are noted in the HPI. Otherwise, ROS is negative.  Vitals:   Vitals:   04/15/18 1409  BP: 124/82  Pulse: 74  Temp: 98.5 F (36.9 C)  TempSrc: Oral  SpO2: 95%  Weight: 263 lb 3.2 oz (119.4 kg)  Height: 5\' 7"  (1.702 m)     Body mass index is 41.22 kg/m.  Physical Exam:   Physical Exam  Constitutional: She appears well-nourished.  HENT:  Head: Normocephalic and atraumatic.  Eyes: Pupils are equal, round, and reactive to light. EOM are normal.  Neck: Normal range of motion. Neck supple.  Cardiovascular: Normal rate, regular rhythm, normal heart sounds and intact distal pulses.  Pulmonary/Chest: Effort normal.  Abdominal: Soft.  Skin: Skin is warm.  Psychiatric: She has a normal mood and affect. Her behavior is normal.  Nursing note and vitals reviewed.  Assessment and Plan:   Lindsey Santiago was  seen today for weight loss.  Diagnoses and all orders for this visit:  Weight gain Morbid obesity (HCC) -     phentermine (ADIPEX-P) 37.5 MG tablet; Take 1 tablet (37.5 mg total) by mouth daily before breakfast.  Contraindications to weight loss: none. Patient readiness to commit to diet and activity changes: excellent. Barriers to weight loss: stress (school), time limitations.  Plan: 1. Diagnostic studies to rule out secondary causes of obesity: see below. 2. General patient education:   Average sustained weight loss in long-term studies w/lifestyle interventions alone is 10-15 lb.  Importance of long-term maintenance tx in weight loss.  Use non-food self-rewards to reinforce behavior changes.  Elicit support from others; identify saboteurs.  Practical target weight is usually around 2 BMI units below current weight. 3. Diet interventions:   Risks of dieting were reviewed, including fatigue, temporary hair loss, gallstone formation, gout, and with very low calorie diets, electrolyte abnormalities, nutrient inadequacies, and loss of lean body mass. 4. Exercise intervention:   Informal measures, e.g. taking stairs instead of elevator.  Formal exercise regimen options. 5. Other behavioral treatment: stress management. 6. Other treatment: Medication: Phentermine. 7. Patient to keep a weight log that we will review at follow up. 8. Follow up: 1 month and as needed.    . Reviewed expectations re: course of current medical issues. . Discussed self-management  of symptoms. . Outlined signs and symptoms indicating need for more acute intervention. . Patient verbalized understanding and all questions were answered. Marland Kitchen. Health Maintenance issues including appropriate healthy diet, exercise, and smoking avoidance were discussed with patient. . See orders for this visit as documented in the electronic medical record. . Patient received an After Visit Summary.  Helane RimaErica Camille Dragan,  DO [place] [place] 04/15/2018  No future appointments.  CMA served as Neurosurgeonscribe during this visit. History, Physical, and Plan performed by medical provider. The above documentation has been reviewed and is accurate and complete. Helane RimaErica Keyira Mondesir, D.O.
